# Patient Record
Sex: Female | Born: 1970 | Race: White | Hispanic: No | Marital: Married | Smoking: Former smoker
Health system: Southern US, Community
[De-identification: ages and names within clinical notes are randomized; demographics above are authoritative.]

## PROBLEM LIST (undated history)

## (undated) DIAGNOSIS — D72829 Elevated white blood cell count, unspecified: Secondary | ICD-10-CM

## (undated) HISTORY — DX: Elevated white blood cell count, unspecified: D72.829

## (undated) HISTORY — PX: ELBOW SURGERY: SHX618

## (undated) HISTORY — PX: HERNIA REPAIR: SHX51

## (undated) HISTORY — PX: CHOLECYSTECTOMY: SHX55

## (undated) HISTORY — PX: TONSILLECTOMY: SUR1361

---

## 2004-02-05 DIAGNOSIS — D229 Melanocytic nevi, unspecified: Secondary | ICD-10-CM

## 2004-02-05 HISTORY — DX: Melanocytic nevi, unspecified: D22.9

## 2006-02-27 ENCOUNTER — Emergency Department (HOSPITAL_COMMUNITY): Admission: EM | Admit: 2006-02-27 | Discharge: 2006-02-27 | Payer: Self-pay | Admitting: Emergency Medicine

## 2007-08-08 ENCOUNTER — Inpatient Hospital Stay (HOSPITAL_COMMUNITY): Admission: AD | Admit: 2007-08-08 | Discharge: 2007-08-08 | Payer: Self-pay | Admitting: Obstetrics

## 2007-08-09 ENCOUNTER — Inpatient Hospital Stay (HOSPITAL_COMMUNITY): Admission: AD | Admit: 2007-08-09 | Discharge: 2007-08-09 | Payer: Self-pay | Admitting: Obstetrics

## 2007-08-18 ENCOUNTER — Inpatient Hospital Stay (HOSPITAL_COMMUNITY): Admission: AD | Admit: 2007-08-18 | Discharge: 2007-08-21 | Payer: Self-pay | Admitting: *Deleted

## 2007-08-18 ENCOUNTER — Inpatient Hospital Stay (HOSPITAL_COMMUNITY): Admission: AD | Admit: 2007-08-18 | Discharge: 2007-08-18 | Payer: Self-pay | Admitting: *Deleted

## 2007-09-22 ENCOUNTER — Inpatient Hospital Stay (HOSPITAL_COMMUNITY): Admission: AD | Admit: 2007-09-22 | Discharge: 2007-09-25 | Payer: Self-pay | Admitting: *Deleted

## 2007-09-22 ENCOUNTER — Encounter (INDEPENDENT_AMBULATORY_CARE_PROVIDER_SITE_OTHER): Payer: Self-pay | Admitting: *Deleted

## 2010-07-13 ENCOUNTER — Encounter
Admission: RE | Admit: 2010-07-13 | Discharge: 2010-07-13 | Payer: Self-pay | Source: Home / Self Care | Attending: Unknown Physician Specialty | Admitting: Unknown Physician Specialty

## 2010-10-11 ENCOUNTER — Encounter (HOSPITAL_COMMUNITY): Payer: BC Managed Care – PPO

## 2010-10-11 ENCOUNTER — Other Ambulatory Visit: Payer: Self-pay | Admitting: General Surgery

## 2010-10-11 DIAGNOSIS — Z01812 Encounter for preprocedural laboratory examination: Secondary | ICD-10-CM | POA: Insufficient documentation

## 2010-10-11 DIAGNOSIS — Z01818 Encounter for other preprocedural examination: Secondary | ICD-10-CM | POA: Insufficient documentation

## 2010-10-11 LAB — CBC
MCHC: 33 g/dL (ref 30.0–36.0)
Platelets: 391 10*3/uL (ref 150–400)
RDW: 12.2 % (ref 11.5–15.5)
WBC: 13.3 10*3/uL — ABNORMAL HIGH (ref 4.0–10.5)

## 2010-10-11 LAB — BASIC METABOLIC PANEL
CO2: 26 mEq/L (ref 19–32)
Calcium: 9.3 mg/dL (ref 8.4–10.5)
Creatinine, Ser: 0.56 mg/dL (ref 0.4–1.2)
GFR calc Af Amer: >60 mL/min (ref >60)
GFR calc non Af Amer: >60 mL/min (ref >60)
Sodium: 138 mEq/L (ref 135–145)

## 2010-10-11 LAB — DIFFERENTIAL
Basophils Absolute: 0 10*3/uL (ref 0.0–0.1)
Basophils Relative: 0 % (ref 0–1)
Eosinophils Absolute: 0.1 10*3/uL (ref 0.0–0.7)
Eosinophils Relative: 1 % (ref 0–5)
Monocytes Absolute: 1 10*3/uL (ref 0.1–1.0)

## 2010-10-11 LAB — SURGICAL PCR SCREEN
MRSA, PCR: NEGATIVE
Staphylococcus aureus: NEGATIVE

## 2010-10-11 LAB — HCG, SERUM, QUALITATIVE: Preg, Serum: NEGATIVE

## 2010-10-14 ENCOUNTER — Other Ambulatory Visit: Payer: Self-pay | Admitting: General Surgery

## 2010-10-14 ENCOUNTER — Ambulatory Visit (HOSPITAL_COMMUNITY)
Admission: RE | Admit: 2010-10-14 | Discharge: 2010-10-14 | Disposition: A | Discharge status: Home / Self Care | Payer: BC Managed Care – PPO | Source: Ambulatory Visit | Attending: General Surgery | Admitting: General Surgery

## 2010-10-14 DIAGNOSIS — T81509A Unspecified complication of foreign body accidentally left in body following unspecified procedure, initial encounter: Secondary | ICD-10-CM | POA: Insufficient documentation

## 2010-10-14 DIAGNOSIS — K429 Umbilical hernia without obstruction or gangrene: Secondary | ICD-10-CM | POA: Insufficient documentation

## 2010-10-14 DIAGNOSIS — L923 Foreign body granuloma of the skin and subcutaneous tissue: Secondary | ICD-10-CM | POA: Insufficient documentation

## 2010-10-14 DIAGNOSIS — T81508A Unspecified complication of foreign body accidentally left in body following other procedure, initial encounter: Secondary | ICD-10-CM | POA: Insufficient documentation
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
## 2010-11-09 NOTE — Discharge Summary (Signed)
NAME:  Laura Blankenship, Laura Blankenship                 ACCOUNT NO.:  [id number]   MEDICAL RECORD NO.:  [id number]          PATIENT TYPE:  INP   LOCATION:  9175                          FACILITY:  [facility]   PHYSICIAN:  Gerri Spore B. Earlene Plater, M.D.  DATE OF BIRTH:  04/06/1971   DATE OF ADMISSION:  08/18/2007  DATE OF DISCHARGE:  08/21/2007                               DISCHARGE SUMMARY   ADMISSION DIAGNOSES:  1. A 34-week intrauterine pregnancy.  2. Preterm contractions.   DISCHARGE DIAGNOSES:  1. A 34-week intrauterine pregnancy.  2. Preterm contractions.   PROCEDURE:  Amniocentesis for fetal lung maturity (results consistent  with immaturity).   HISTORY OF PRESENT ILLNESS:  The patient presented with complaints of  regular painful contractions which were coming every 2-3 minutes for  several hours.  Cervix did efface to 90% and 1 cm dilation, but not  beyond this.  However, her contractions were extremely painful, and she  had a history of previous LEEP procedure; therefore, it was determined  that she might be in preterm labor, although the scarring from the LEEP  was preventing cervix from dilating as it felt very tight.   HOSPITAL COURSE:  The patient was admitted, monitored, ultimately given  an epidural for pain control, due to her persistent regular  contractions.  She did not dilate beyond 1 cm.  Ultimately,  amniocentesis was performed for fetal lung maturity, which was immature.  Contractions were stopped with Procardia, and the patient discharged to  home with labor precautions.  Follow up within the week at [facility].   DISPOSITION:  Discharged satisfactory.   DISCHARGE INSTRUCTIONS:  Labor precautions.      Gerri Spore B. Earlene Plater, M.D.  Electronically Signed     WBD/MEDQ  D:  09/11/2007  T:  09/11/2007  Job:  [id number]

## 2010-11-09 NOTE — Op Note (Signed)
NAME:  Laura Blankenship, Laura Blankenship                 ACCOUNT NO.:  [id number]   MEDICAL RECORD NO.:  [id number]          PATIENT TYPE:  INP   LOCATION:  9131                          FACILITY:  [facility]   PHYSICIAN:  Gerri Spore B. Earlene Plater, M.D.  DATE OF BIRTH:  1970-10-05   DATE OF PROCEDURE:  09/22/2007  DATE OF DISCHARGE:                               OPERATIVE REPORT   PREOPERATIVE DIAGNOSIS:  Repetitive variable decelerations.   POSTOPERATIVE DIAGNOSIS:  Repetitive variable decelerations.   PROCEDURE:  Attempted vacuum delivery followed by a primary low  transverse C-section.   SURGEON:  Chester Holstein. Earlene Plater, M.D.   ASSISTANT:  Marlinda Mike, C.N.M.   ANESTHESIA:  Epidural.   SPECIMENS:  Placenta submitted to pathology.   BLOOD LOSS:  500.   COMPLICATIONS:  None.   FINDINGS:  Viable female, Apgars 9 and 9.  Body cord noted anteriorly.  Weight 7 pounds 3 ounces.  Normal-appearing uterus, tubes and ovaries.   INDICATIONS:  The patient presented with spontaneous rupture of  membranes at term today.  Had a normal labor course and was pushing from  complete, complete, +2-+3, LOA position when she developed repetitive  variable decelerations with fetal heart rate dropping into the 60s to  70s range.  I counseled the patient that vacuum delivery would expedite  the delivery and if unsuccessful would recommend a C-section given the  repetitive variable decelerations.  The risks and benefits were  discussed.   PROCEDURE:  The patient was initially in the labor delivery room,  complete, complete +2/+3, LOA under epidural.  The bladder had been  drained just immediately prior to the procedure.  The Mityvac Mushroom  cup was placed on the flexion point in the midline.  From the high green  zone, three pulls were attempted.  One pop-off encountered with no  descent.  It felt to me that the pubic arch was a bit narrow, and this  was limiting our ability and that episiotomy would not gain much  additional room.   Given the repetitive decelerations and unsuccessful  vacuum, I recommended we proceed to C-section.   The OR was called.  Stat section called.  The patient rapidly taken  back.   A Pfannenstiel incision made.  The fascia divided sharply, elevated, and  the posteriorly lying rectus muscles were dissected off sharply.  Repeated inferiorly in similar manner.  Posterior sheath and peritoneum  were elevated and incised sharply.  Bladder blade inserted.  Bladder  flap created sharply.   Uterine incision made in low transverse fashion with a knife.  Clear  fluid at amniotomy.  Incision was extended bluntly.  Vertex elevated  through the incision and with fundal pressure delivered out difficulty.  Nose and mouth suctioned with the bulb.  Remainder of the infant  delivered without difficulty.  Cord was clamped and cut.  Infant handed  off to awaiting pediatricians.  Mefoxin was given at cord clamp.  The  placenta was removed by uterine massage.  Uterus was exteriorized and  cleared of all clots and debris.  It was free of extension.  Uterine  incision closed in a running locked fashion with 0 chromic.  Second  imbricating layer placed with same suture.  Hemostasis obtained.   Uterus was turned to the abdomen, pelvis irrigated, cleared of all clots  and debris.  Uterine incision and bladder flap were hemostatic.  Subfascial space was hemostatic.  Fascia was closed with running stitch  of 0 Vicryl.  Subcutaneous tissue was irrigated and was hemostatic.  Skin was closed with staples.   The patient tolerated procedure well with no complications.  She was  taken to the recovery room awake, alert, and in stable condition.  All  counts correct per the operating room staff.      Gerri Spore B. Earlene Plater, M.D.  Electronically Signed     WBD/MEDQ  D:  09/22/2007  T:  09/23/2007  Job:  [id number]

## 2010-11-12 NOTE — Discharge Summary (Signed)
NAME:  Laura Blankenship, Laura Blankenship                 ACCOUNT NO.:  [id number]   MEDICAL RECORD NO.:  [id number]          PATIENT TYPE:  INP   LOCATION:  9131                          FACILITY:  [facility]   PHYSICIAN:  Gerri Spore B. Earlene Plater, M.D.  DATE OF BIRTH:  12/11/1970   DATE OF ADMISSION:  09/22/2007  DATE OF DISCHARGE:  09/25/2007                               DISCHARGE SUMMARY   ADMISSION DIAGNOSES:  1. 39-week pregnancy.  2. Spontaneous labor.   DISCHARGE DIAGNOSES:  1. 39-week pregnancy.  2. Spontaneous labor.   PROCEDURES:  Unsuccessful trial of vacuum for repetitive variables,  subsequent primary C-section.   HISTORY OF PRESENT ILLNESS:  For complete details, please see the H&P on  the chart.  Briefly, the patient presented at 39 weeks spontaneous  labor.  She progressed to complete and was pushing at +2 stage when she  was noted to have repetitive variable decelerations to the 70s.  Given  this, I was called and requested to attempt a vacuum delivery.   HOSPITAL COURSE:  Subsequently, attempt was made for vacuum delivery  without success followed by C-section ultimately delivered a viable  female.  Apgars were 9 and 9.  Body cord noted anteriorly.  Weight was 7  pounds 3 ounces.   Postoperatively, the patient rapidly regained her ability to ambulate,  void, and tolerate regular diet.  She was discharged home on the third  postoperative day in satisfactory condition.   DISCHARGE INSTRUCTIONS:  Per booklet.   FOLLOWUP:  6 weeks [facility].   DISPOSITION AT DISCHARGE:  Satisfactory.   Of note, the baby was admitted to the NICU postpartum for seizures.  This was ultimately determined to be related to a stroke on MRI.  It Is  unclear when the stroke occurred, but the baby was stable and doing well  at the time of the mother's discharge.      Gerri Spore B. Earlene Plater, M.D.  Electronically Signed    WBD/MEDQ  D:  10/17/2007  T:  10/18/2007  Job:  [id number]

## 2011-03-18 LAB — GLUCOSE, SEROUS FLUID: Glucose, Fluid: 37

## 2011-03-18 LAB — CBC
HCT: 34.8 — ABNORMAL LOW
Platelets: 415 — ABNORMAL HIGH
RDW: 12.9

## 2011-03-18 LAB — LEUKOCYTE ESTERASE, AMNIOTIC FLUID: Leukocyte esterase, amniotic: NEGATIVE

## 2011-03-18 LAB — FLM(FETAL LUNG MATURITY), AMNIOTIC FLUID: Fetal/Lung Maturity: 29.9

## 2011-03-18 LAB — RPR: RPR Ser Ql: NONREACTIVE

## 2011-03-18 LAB — BODY FLUID CELL COUNT WITH DIFFERENTIAL: Total Nucleated Cell Count, Fluid: 1

## 2011-03-18 LAB — BODY FLUID CULTURE: Gram Stain: NONE SEEN

## 2011-03-18 LAB — STREP B DNA PROBE

## 2011-03-21 LAB — CBC
HCT: 33.7 — ABNORMAL LOW
Hemoglobin: 10.3 — ABNORMAL LOW
MCHC: 35.5
MCV: 88.3
Platelets: 321
RBC: 3.29 — ABNORMAL LOW
WBC: 14.3 — ABNORMAL HIGH

## 2011-03-21 LAB — COMPREHENSIVE METABOLIC PANEL
ALT: 12
Albumin: 2.8 — ABNORMAL LOW
Alkaline Phosphatase: 199 — ABNORMAL HIGH
Potassium: 4.2
Sodium: 133 — ABNORMAL LOW
Total Protein: 5.9 — ABNORMAL LOW

## 2011-03-21 LAB — RPR: RPR Ser Ql: NONREACTIVE

## 2012-04-27 DIAGNOSIS — Z0000 Encounter for general adult medical examination without abnormal findings: Secondary | ICD-10-CM | POA: Insufficient documentation

## 2012-12-19 ENCOUNTER — Other Ambulatory Visit: Payer: Self-pay | Admitting: Physician Assistant

## 2013-06-15 DIAGNOSIS — D72829 Elevated white blood cell count, unspecified: Secondary | ICD-10-CM | POA: Insufficient documentation

## 2013-06-18 ENCOUNTER — Ambulatory Visit (HOSPITAL_BASED_OUTPATIENT_CLINIC_OR_DEPARTMENT_OTHER)
Admission: RE | Admit: 2013-06-18 | Discharge: 2013-06-18 | Disposition: A | Discharge status: Home / Self Care | Payer: BC Managed Care – PPO | Source: Ambulatory Visit | Attending: Family Medicine | Admitting: Family Medicine

## 2013-06-18 ENCOUNTER — Other Ambulatory Visit (HOSPITAL_BASED_OUTPATIENT_CLINIC_OR_DEPARTMENT_OTHER): Payer: Self-pay | Admitting: Family Medicine

## 2013-06-18 DIAGNOSIS — R1011 Right upper quadrant pain: Secondary | ICD-10-CM | POA: Insufficient documentation

## 2013-06-18 DIAGNOSIS — R110 Nausea: Secondary | ICD-10-CM | POA: Insufficient documentation

## 2013-06-18 DIAGNOSIS — K828 Other specified diseases of gallbladder: Secondary | ICD-10-CM | POA: Insufficient documentation

## 2013-06-18 DIAGNOSIS — R1013 Epigastric pain: Secondary | ICD-10-CM

## 2014-02-13 DIAGNOSIS — K824 Cholesterolosis of gallbladder: Secondary | ICD-10-CM | POA: Insufficient documentation

## 2014-04-21 DIAGNOSIS — Z803 Family history of malignant neoplasm of breast: Secondary | ICD-10-CM | POA: Insufficient documentation

## 2014-04-21 DIAGNOSIS — Z800 Family history of malignant neoplasm of digestive organs: Secondary | ICD-10-CM | POA: Insufficient documentation

## 2015-11-28 IMAGING — US US ABDOMEN COMPLETE
1 series · 13 of 25 positions shown · non-contrast
Comparison: None.

CLINICAL DATA: Nausea, epigastric and right upper quadrant
discomfort.

EXAM:
ULTRASOUND ABDOMEN COMPLETE

[Series 1: us abdomen complete · 0.30mm/px · 13 of 77 slices shown]
[im 1/77]
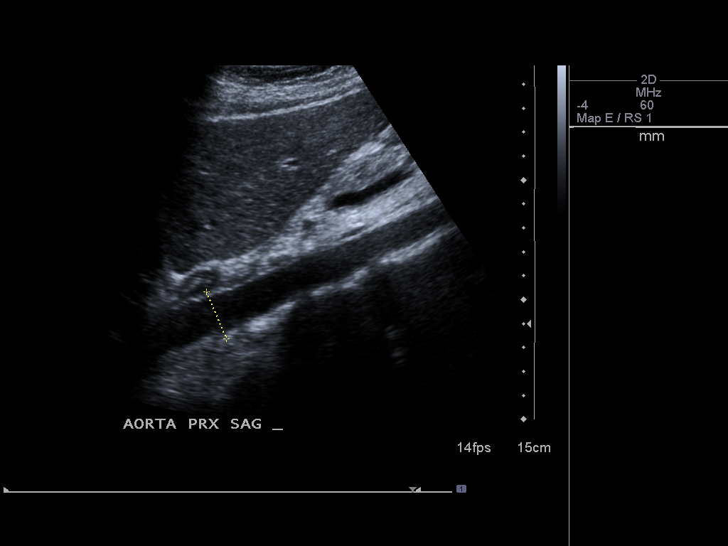
[im 7/77]
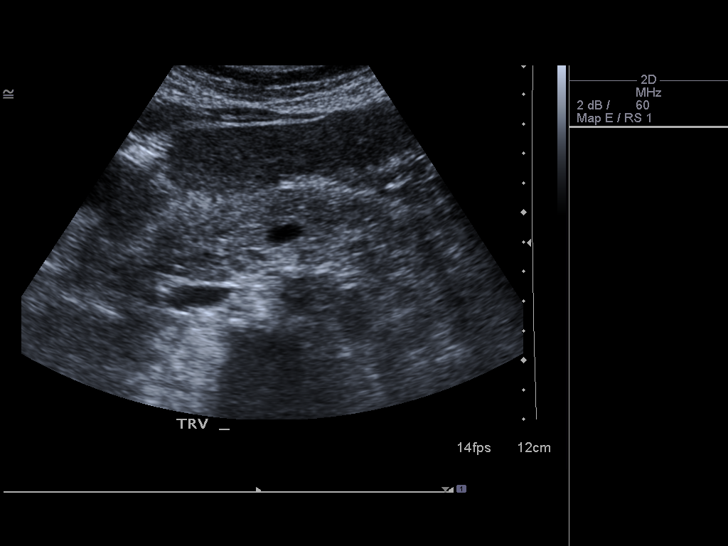
[im 13/77]
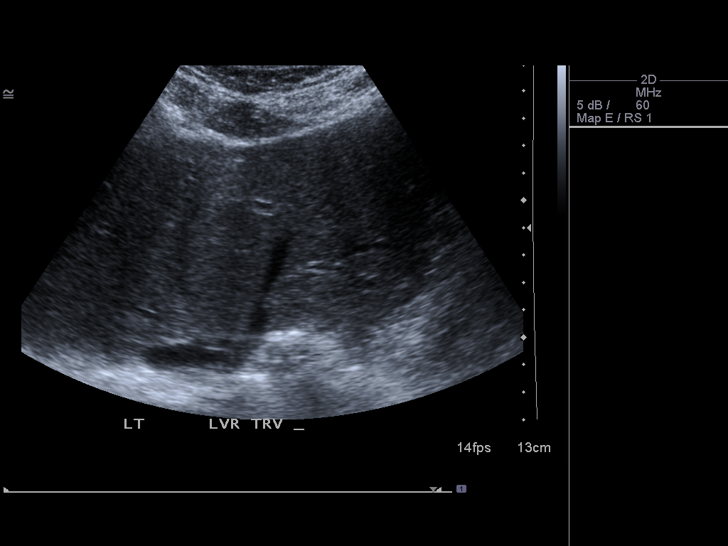
[im 20/77]
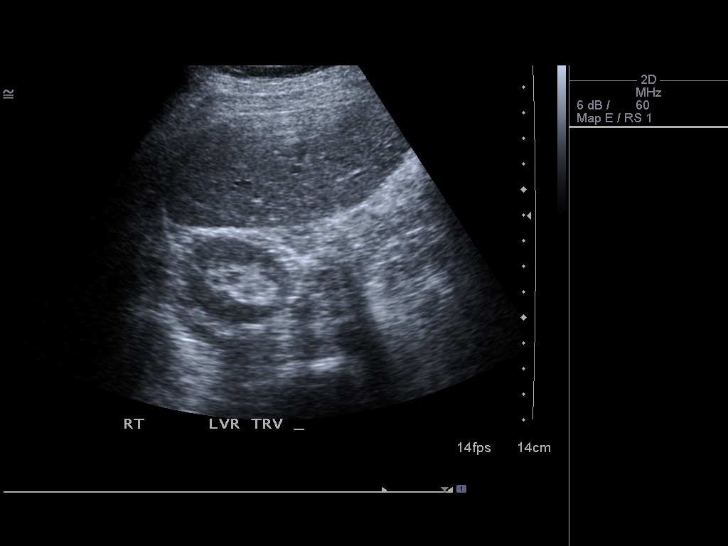
[im 26/77]
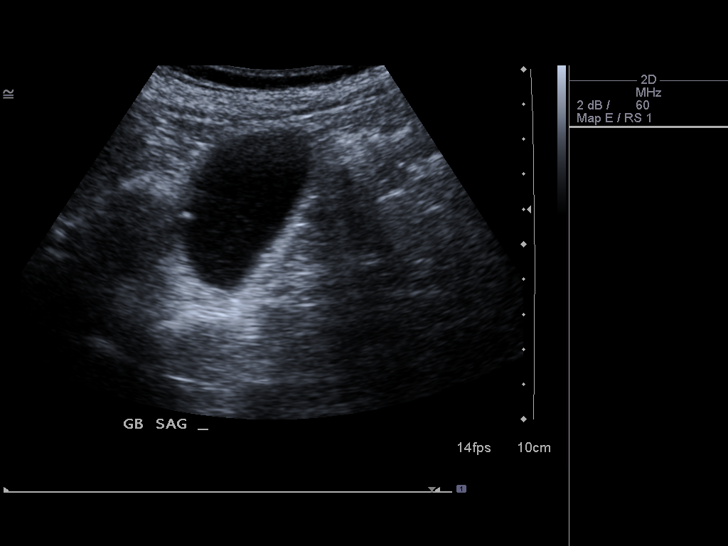
[im 32/77]
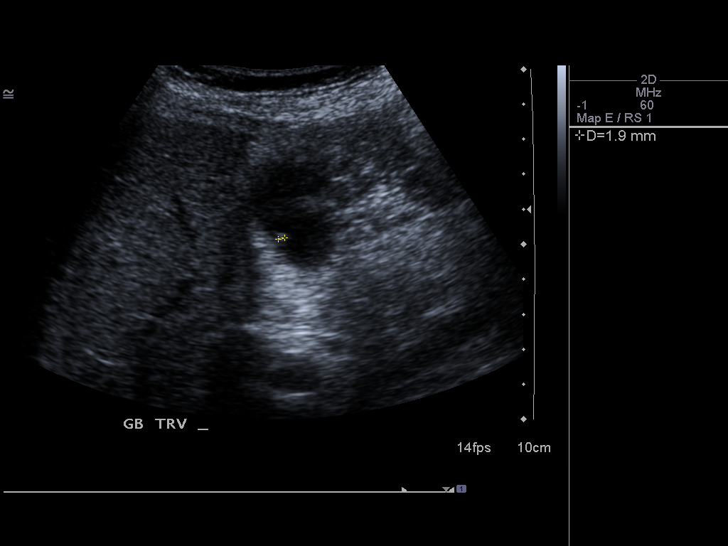
[im 39/77]
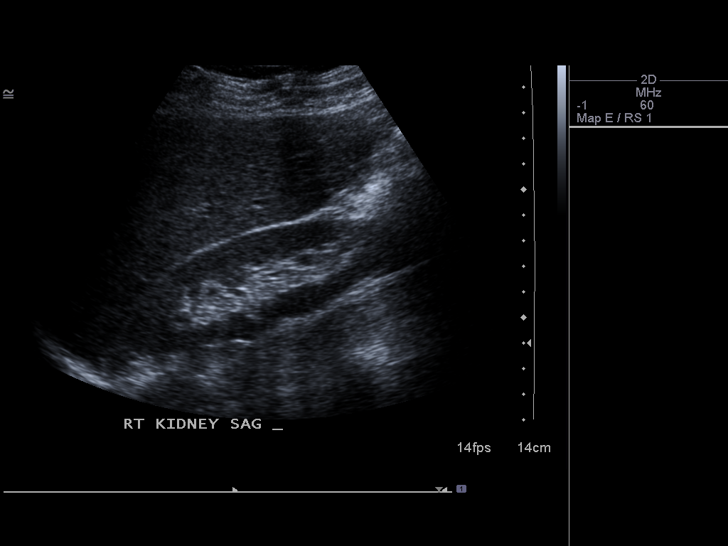
[im 45/77]
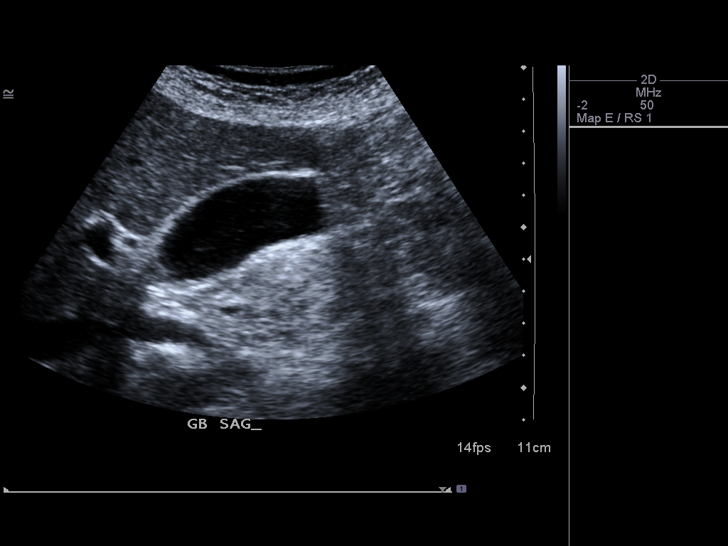
[im 51/77]
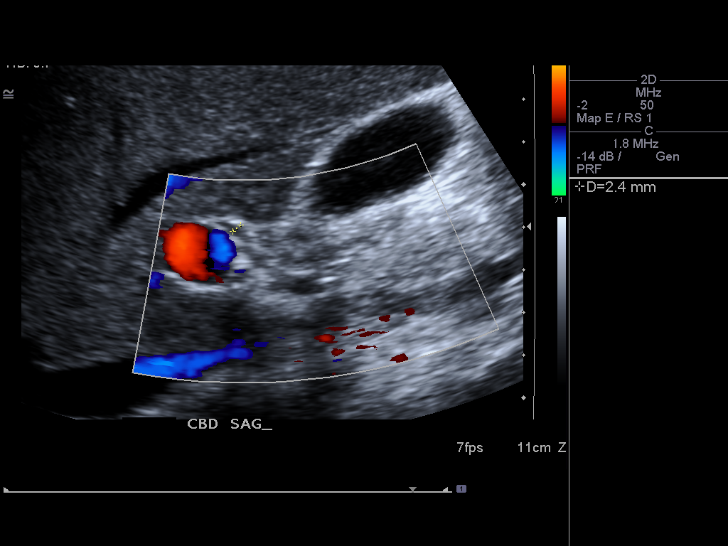
[im 58/77]
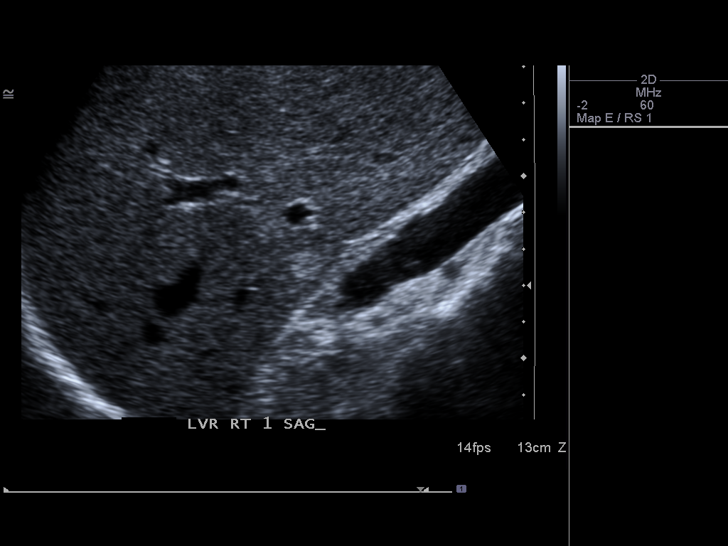
[im 64/77]
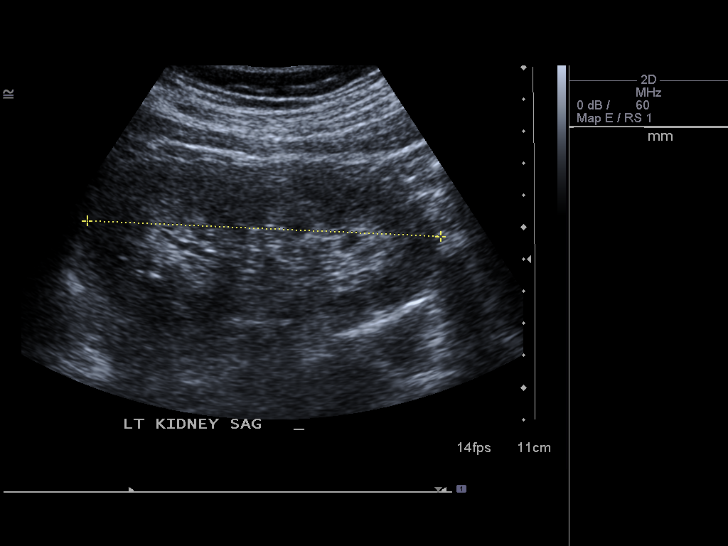
[im 70/77]
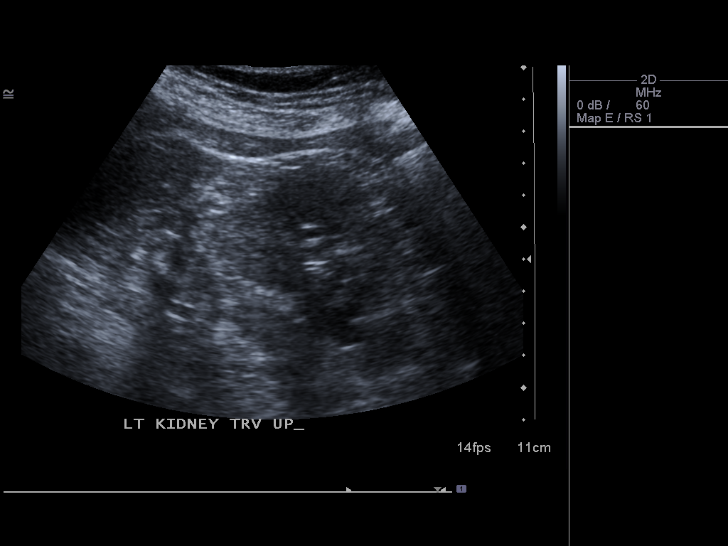
[im 77/77]
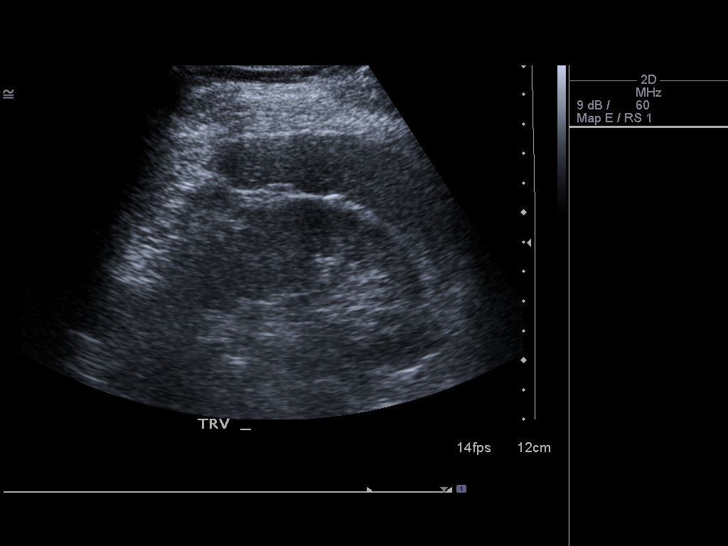

[13 of 25 positions shown; findings below may reference images not displayed]

FINDINGS: Gallbladder:

The gallbladder is adequately distended. No mobile stones are
demonstrated. There is echogenic bile present versus sludge. There
is a 2 mm diameter adherent echogenic focus along the gallbladder
wall that suggests a polyp versus adherent stone. There is no
pericholecystic fluid. The gallbladder wall measures 3 mm in
thickness. There is no positive sonographic Murphy's sign.

Common bile duct:

Diameter: 2 mm

Liver:

The echotexture of the liver is within the limits of normal.
Posteriorly in the right lobe there is a 0.9 x 0.9 x 0.7 cm diameter
hyperechoic focus which likely reflects a hemangioma. There is no
intrahepatic ductal dilation.

IVC:

Bowel gas limits evaluation of the inferior vena cava.  .

Pancreas:

Visualized portion unremarkable.

Spleen:

Size and appearance within normal limits.

Right Kidney:

Length: 11.2 cm. Echogenicity within normal limits. No mass or
hydronephrosis visualized.

Left Kidney:

Length: 11 cm. Echogenicity within normal limits. No mass or
hydronephrosis visualized.

Abdominal aorta:

Evaluation of the abdominal aorta is limited. No aneurysm is
demonstrated. .

Other findings:

None.
IMPRESSION: 1. The gallbladder contains echogenic bile and there is likely an
adherent stone versus polyp measuring 2 mm. There is no gallbladder
wall thickening or pericholecystic fluid or positive sonographic
Murphy's sign.
2. The liver, pancreas, and spleen exhibit no acute abnormalities.
There is likely a subcentimeter hemangioma posteriorly in the right
hepatic lobe.
3. The kidneys appear normal.
4. Where visualized the abdominal aorta and inferior vena cava
appear normal.

## 2018-04-27 ENCOUNTER — Encounter: Payer: Self-pay | Admitting: Podiatry

## 2018-04-27 ENCOUNTER — Ambulatory Visit: Payer: BLUE CROSS/BLUE SHIELD | Admitting: Podiatry

## 2018-04-27 ENCOUNTER — Ambulatory Visit (INDEPENDENT_AMBULATORY_CARE_PROVIDER_SITE_OTHER): Payer: BLUE CROSS/BLUE SHIELD

## 2018-04-27 DIAGNOSIS — M722 Plantar fascial fibromatosis: Secondary | ICD-10-CM | POA: Diagnosis not present

## 2018-04-27 NOTE — Patient Instructions (Signed)

## 2018-04-27 NOTE — Progress Notes (Signed)
   Subjective:    Patient ID: Laura Blankenship, female    DOB: 29-May-1971, 46 y.o.   MRN: [id number]  HPI  47 year old female presents the office today for concerns of a soft tissue mass in the arch of the right foot which is been ongoing for about 6 months.  He states that it is about the same size and she just first noticed it 6 months ago.  It hurts more with going barefoot or wearing a flat shoe.  She said no recent treatment.  She has no other concerns today.  Review of Systems  All other systems reviewed and are negative.  History reviewed. No pertinent past medical history.  History reviewed. No pertinent surgical history.   Current Outpatient Medications:  .  NON FORMULARY, [facility]  Scar Cream -  Verapamil 10%, Pentoxifylline 5% Apply 1-2 grams to affected area 3-4 times daily Qty. 120 gm 3 refills, Disp: , Rfl:   No Known Allergies      Objective:   Physical Exam   General: AAO x3, NAD  Dermatological: Skin is warm, dry and supple bilateral. Nails x 10 are well manicured; remaining integument appears unremarkable at this time. There are no open sores, no preulcerative lesions, no rash or signs of infection present.  Vascular: Dorsalis Pedis artery and Posterior Tibial artery pedal pulses are 2/4 bilateral with immedate capillary fill time. Pedal hair growth present. No varicosities and no lower extremity edema present bilateral. There is no pain with calf compression, swelling, warmth, erythema.   Neruologic: Grossly intact via light touch bilateral. Protective threshold with Semmes Wienstein monofilament intact to all pedal sites bilateral.   Musculoskeletal: Along the medial band of plantar fascia within the arch of the foot on the right side is a firm non-mobile soft tissue mass consistent with a plantar fibroma.  Mild discomfort to the area there is no overlying edema, erythema or any skin change.  No other areas of tenderness or soft tissue mass are identified.   Muscular strength 5/5 in all groups tested bilateral.  Gait: Unassisted, Nonantalgic.     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  No evidence of calcifications or bony destruction.  No evidence of acute fracture or foreign body. -Steroid injection performed.  See procedure note below. -Ordered a compound cream to include verapamil to apply daily.  Discussed general stretching exercises for the plantar fascia as well.  Offloading pads dispensed.  Procedure: Injection plantar fibroma Discussed alternatives, risks, complications and verbal consent was obtained.  Location: Right plantar fibroma; medial approach. Skin Prep: Alcohol. Injectate: 0.5cc 0.5% marcaine plain, 0.5 cc 2% lidocaine plain and, 1 cc kenalog 10. Disposition: Patient tolerated procedure well. Injection site dressed with a band-aid.  Post-injection care was discussed and return precautions discussed.   Trula Slade DPM

## 2018-04-29 DIAGNOSIS — M722 Plantar fascial fibromatosis: Secondary | ICD-10-CM | POA: Insufficient documentation

## 2018-06-01 ENCOUNTER — Ambulatory Visit: Payer: BLUE CROSS/BLUE SHIELD | Admitting: Podiatry

## 2018-06-08 ENCOUNTER — Ambulatory Visit: Payer: BLUE CROSS/BLUE SHIELD | Admitting: Podiatry

## 2018-06-08 DIAGNOSIS — M722 Plantar fascial fibromatosis: Secondary | ICD-10-CM

## 2018-06-08 MED ORDER — TRIAMCINOLONE ACETONIDE 10 MG/ML IJ SUSP
10.0000 mg | Freq: Once | INTRAMUSCULAR | Status: AC
  Administered 2018-06-08: 10 mg

## 2018-06-13 NOTE — Progress Notes (Signed)
Subjective: 47 year old female presents the office today for follow-up evaluation of a plantar fibroma in her right foot.  She states that overall she is doing much better and the area is smaller and the injection did help and she had no complications.  He is also been using the verapamil cream daily as well.  She has no new concerns. Denies any systemic complaints such as fevers, chills, nausea, vomiting. No acute changes since last appointment, and no other complaints at this time.   Objective: AAO x3, NAD DP/PT pulses palpable bilaterally, CRT less than 3 seconds On the medial band plantar fashion the arch of the foot on the right side there is a small area of a plantar fibroma still evident but overall is much smaller.  There is no significant edema, erythema and very minimal tenderness subjectively and there is no tenderness today.  No new lesions are identified.  No open lesions or pre-ulcerative lesions.  No pain with calf compression, swelling, warmth, erythema  Assessment: Plantar fibromatosis right side  Plan: -All treatment options discussed with the patient including all alternatives, risks, complications.  -Steroid injection performed today.  See procedure note below.  Continue with the verapamil cream daily.  Discussed stretching exercises daily. -Follow-up next 4 to 6 weeks if symptoms continue with is any worsening let us know otherwise we will see her as needed. -Patient encouraged to call the office with any questions, concerns, change in symptoms.   Procedure: Injection Tendon/Ligament  Discussed alternatives, risks, complications and verbal consent was obtained.  Location: Right plantar fibroma Skin Prep: Alcohol. Injectate: 0.5cc 0.5% marcaine plain, 0.5 cc 2% lidocaine plain and, 1 cc kenalog 10. Disposition: Patient tolerated procedure well. Injection site dressed with a band-aid.  Post-injection care was discussed and return precautions discussed.   Trula Slade  DPM

## 2019-06-17 ENCOUNTER — Other Ambulatory Visit: Payer: Self-pay | Admitting: Ophthalmology

## 2019-10-18 ENCOUNTER — Other Ambulatory Visit: Payer: Self-pay

## 2019-10-18 ENCOUNTER — Encounter: Payer: Self-pay | Admitting: Physician Assistant

## 2019-10-18 ENCOUNTER — Ambulatory Visit: Payer: BC Managed Care – PPO | Admitting: Physician Assistant

## 2019-10-18 DIAGNOSIS — L700 Acne vulgaris: Secondary | ICD-10-CM

## 2019-10-18 DIAGNOSIS — D485 Neoplasm of uncertain behavior of skin: Secondary | ICD-10-CM

## 2019-10-18 DIAGNOSIS — Z1283 Encounter for screening for malignant neoplasm of skin: Secondary | ICD-10-CM | POA: Diagnosis not present

## 2019-10-18 NOTE — Patient Instructions (Signed)

## 2019-10-18 NOTE — Progress Notes (Signed)
   New Patient   Subjective  Laura Blankenship is a 49 y.o. female who presents for the following: Annual Exam (Here for overdue skin check. Does have a place under her right breast that seems to have increase in size. ).  Location: Right upper abdomen Duration: year plus Quality: white, smooth growth Associated Signs/Symptoms:none Modifying Factors: persistent growing     The following portions of the chart were reviewed this encounter and updated as appropriate: Tobacco  Allergies  Meds  Problems  Med Hx  Surg Hx  Fam Hx      Objective  Well appearing patient in no apparent distress; mood and affect are within normal limits.  A full examination was performed including scalp, head, eyes, ears, nose, lips, neck, chest, axillae, abdomen, back, buttocks, bilateral upper extremities, bilateral lower extremities, hands, feet, fingers, toes, fingernails, and toenails. All findings within normal limits unless otherwise noted below.  Objective  Right Abdomen (side) - Upper: Bichromic dark nested macule.      Objective  Mid Back: Dark , raised cyst  Objective  head to toe: No signs nmsc/DN   Assessment & Plan  Neoplasm of uncertain behavior of skin Right Abdomen (side) - Upper  Skin / nail biopsy Type of biopsy: tangential   Informed consent: discussed and consent obtained   Timeout: patient name, date of birth, surgical site, and procedure verified   Anesthesia: the lesion was anesthetized in a standard fashion   Anesthetic:  1% lidocaine w/ epinephrine 1-100,000 local infiltration Instrument used: flexible razor blade   Hemostasis achieved with: aluminum chloride and electrodesiccation   Outcome: patient tolerated procedure well   Post-procedure details: wound care instructions given   Additional details:  Cautery only  Specimen 1 - Surgical pathology Differential Diagnosis: DN Check Margins: No  Open comedone Mid Back  Schedule for a biopsy  Screening exam  for skin cancer head to toe  observe

## 2019-11-29 DIAGNOSIS — R601 Generalized edema: Secondary | ICD-10-CM | POA: Diagnosis not present

## 2019-11-29 DIAGNOSIS — I340 Nonrheumatic mitral (valve) insufficiency: Secondary | ICD-10-CM | POA: Diagnosis not present

## 2019-12-11 ENCOUNTER — Encounter: Payer: Self-pay | Admitting: *Deleted

## 2019-12-12 DIAGNOSIS — R600 Localized edema: Secondary | ICD-10-CM | POA: Diagnosis not present

## 2019-12-12 DIAGNOSIS — E041 Nontoxic single thyroid nodule: Secondary | ICD-10-CM | POA: Diagnosis not present

## 2019-12-12 DIAGNOSIS — D709 Neutropenia, unspecified: Secondary | ICD-10-CM | POA: Diagnosis not present

## 2019-12-12 DIAGNOSIS — D72821 Monocytosis (symptomatic): Secondary | ICD-10-CM | POA: Diagnosis not present

## 2019-12-12 DIAGNOSIS — D72829 Elevated white blood cell count, unspecified: Secondary | ICD-10-CM | POA: Diagnosis not present

## 2019-12-12 DIAGNOSIS — Z803 Family history of malignant neoplasm of breast: Secondary | ICD-10-CM | POA: Diagnosis not present

## 2019-12-13 ENCOUNTER — Encounter: Payer: Self-pay | Admitting: Physician Assistant

## 2019-12-13 ENCOUNTER — Other Ambulatory Visit: Payer: Self-pay

## 2019-12-13 ENCOUNTER — Ambulatory Visit (INDEPENDENT_AMBULATORY_CARE_PROVIDER_SITE_OTHER): Payer: BC Managed Care – PPO | Admitting: Physician Assistant

## 2019-12-13 DIAGNOSIS — D485 Neoplasm of uncertain behavior of skin: Secondary | ICD-10-CM

## 2019-12-13 DIAGNOSIS — L720 Epidermal cyst: Secondary | ICD-10-CM | POA: Diagnosis not present

## 2019-12-13 NOTE — Progress Notes (Signed)
   Follow-Up Visit   Subjective  Laura Blankenship is a 49 y.o. female who presents for the following: Procedure (Punch biopsy on cyst right back. ).    The following portions of the chart were reviewed this encounter and updated as appropriate: Tobacco  Allergies  Meds  Problems  Med Hx  Surg Hx  Fam Hx      Objective  Well appearing patient in no apparent distress; mood and affect are within normal limits.  All skin waist up examined.  Objective  Left Upper Back: Nodule with dark central plug  Assessment & Plan  Neoplasm of uncertain behavior of skin Left Upper Back  Skin / nail biopsy Type of biopsy: punch   Informed consent: discussed and consent obtained   Procedure prep:  Patient was prepped and draped in usual sterile fashion (nonsterile) Prep type:  Chlorhexidine Anesthesia: the lesion was anesthetized in a standard fashion   Anesthetic:  1% lidocaine w/ epinephrine 1-100,000 local infiltration Punch size:  8 mm Suture size:  3-0 Suture type: nylon   Suture removal (days):  14 Hemostasis achieved with: suture   Outcome: patient tolerated procedure well   Post-procedure details: wound care instructions given   Post-procedure details comment:  Nonsterile  Specimen 1 - Surgical pathology Differential Diagnosis: cyst, r/o atypia Check Margins: No    92mm punch 3-0 nylon x 3

## 2019-12-13 NOTE — Patient Instructions (Addendum)
Wound Care Instructions  1. Cleanse wound gently with soap and water once a day then pat dry with clean gauze. Apply a thing coat of Petrolatum (petroleum jelly, "Vaseline") over the wound (unless you have an allergy to this). We recommend that you use a new, sterile tube of Vaseline. Do not pick or remove scabs. Do not remove the yellow or white "healing tissue" from the base of the wound.  2. Cover the wound with fresh, clean, nonstick gauze and secure with paper tape. You may use Band-Aids in place of gauze and tape if the would is small enough, but would recommend trimming much of the tape off as there is often too much. Sometimes Band-Aids can irritate the skin.  3. You should call the office for your biopsy report after 1 week if you have not already been contacted.  4. If you experience any problems, such as abnormal amounts of bleeding, swelling, significant bruising, significant pain, or evidence of infection, please call the office immediately.  5. FOR ADULT SURGERY PATIENTS: If you need something for pain relief you may take 1 extra strength Tylenol (acetaminophen) AND 2 Ibuprofen (200mg  each) together every 4 hours as needed for pain. (do not take these if you are allergic to them or if you have a reason you should not take them.) Typically, you may only need pain medication for 1 to 3 days.    Biopsy, Surgery (Curettage) & Surgery (Excision) Aftercare Instructions  1. Okay to remove bandage in 24 hours  2. Wash area with soap and water  3. Apply Vaseline to area twice daily until healed (Not Neosporin)  4. Okay to cover with a Band-Aid to decrease the chance of infection or prevent irritation from clothing; also it's okay to uncover lesion at home.  5. Suture instructions: return to our office in 7-10 or 10-14 days for a nurse visit for suture removal. Variable healing with sutures, if pain or itching occurs call our office. It's okay to shower or bathe 24 hours after sutures are  given.  6. The following risks may occur after a biopsy, curettage or excision: bleeding, scarring, discoloration, recurrence, infection (redness, yellow drainage, pain or swelling).  7. For questions, concerns and results call our office at [place] before 4pm & Friday before 3pm. Biopsy results will be available in 1 week.

## 2019-12-20 ENCOUNTER — Other Ambulatory Visit: Payer: Self-pay

## 2019-12-20 ENCOUNTER — Ambulatory Visit (INDEPENDENT_AMBULATORY_CARE_PROVIDER_SITE_OTHER): Payer: BC Managed Care – PPO | Admitting: *Deleted

## 2019-12-20 DIAGNOSIS — Z4802 Encounter for removal of sutures: Secondary | ICD-10-CM

## 2019-12-20 NOTE — Progress Notes (Addendum)
Here for NTS suture removal. Looks good no signs of infection. X 3 sutures removed. Applied steri strips to area since patient was a few days early for removal.

## 2019-12-24 NOTE — Progress Notes (Signed)
Referring-Erin Marveen Reeks FNP Reason for referral-mitral regurgitation, generalized edema, palpitations and chest tightness  HPI: 49 year old female for evaluation of mitral regurgitation, generalized edema, palpitations and chest tightness at request of Amador Cunas FNP.  Laboratories May 2021 showed sodium 139, potassium 4.3 and creatinine 0.74; hemoglobin 15.7; TSH 1.212.  Echocardiogram June 2021 showed normal LV function and mild mitral regurgitation.  Patient has dyspnea with more vigorous activities but not routine activities.  No orthopnea, PND, pedal edema.  She has noticed some bilateral shoulder tightness in February, March and April that was relieved with taking Lasix.  She felt she had increased edema in her upper extremities and upper body that resolved with Lasix.  Her shoulder tightness resolved as well.  She does not have exertional chest pain.  She occasionally has palpitations described as her heart racing predominately when waking up suddenly.  Because of the above cardiology asked to evaluate.  Current Outpatient Medications  Medication Sig Dispense Refill  . furosemide (LASIX) 20 MG tablet Take 20 mg by mouth daily as needed.      No current facility-administered medications for this visit.    No Known Allergies   Past Medical History:  Diagnosis Date  . Atypical mole 02/05/2004   Right Upper Breast (Dr. Katrina Stack)  . Atypical mole 02/17/2005   Left Breast (Marked)  . Atypical mole 02/17/2005   Supra Pubic (Moderate to Marked) (Dr. Gerome Apley II)  . Atypical mole 08/28/2008   Left Lateral Abd, Sup (Moderate)  . Atypical mole 08/28/2008   Left Lateral Abd, Inf (Slight to Moderate)  . Atypical mole 08/28/2008   Left Lateral Abd, Medial (Moderate)  . Atypical mole 03/18/2010   Left Tail od Breast (Moderate) (widershave)  . Atypical mole 12/19/2012   Mid Abdomen (widershave)  . Elevated white blood cell count     Past Surgical History:  Procedure Laterality  Date  . CHOLECYSTECTOMY    . ELBOW SURGERY    . HERNIA REPAIR    . TONSILLECTOMY      Social History   Socioeconomic History  . Marital status: Married    Spouse name: Not on file  . Number of children: 2  . Years of education: Not on file  . Highest education level: Not on file  Occupational History  . Not on file  Tobacco Use  . Smoking status: Former Research scientist (life sciences)  . Smokeless tobacco: Never Used  Vaping Use  . Vaping Use: Never used  Substance and Sexual Activity  . Alcohol use: Yes    Comment: Rare  . Drug use: Never  . Sexual activity: Not on file  Other Topics Concern  . Not on file  Social History Narrative  . Not on file   Social Determinants of Health   Financial Resource Strain:   . Difficulty of Paying Living Expenses:   Food Insecurity:   . Worried About Charity fundraiser in the Last Year:   . Arboriculturist in the Last Year:   Transportation Needs:   . Film/video editor (Medical):   Marland Kitchen Lack of Transportation (Non-Medical):   Physical Activity:   . Days of Exercise per Week:   . Minutes of Exercise per Session:   Stress:   . Feeling of Stress :   Social Connections:   . Frequency of Communication with Friends and Family:   . Frequency of Social Gatherings with Friends and Family:   . Attends Religious Services:   . Active  Member of Clubs or Organizations:   . Attends Archivist Meetings:   Marland Kitchen Marital Status:   Intimate Partner Violence:   . Fear of Current or Ex-Partner:   . Emotionally Abused:   Marland Kitchen Physically Abused:   . Sexually Abused:     Family History  Problem Relation Age of Onset  . Breast cancer Mother     ROS: no fevers or chills, productive cough, hemoptysis, dysphasia, odynophagia, melena, hematochezia, dysuria, hematuria, rash, seizure activity, orthopnea, PND, pedal edema, claudication. Remaining systems are negative.  Physical Exam:   Blood pressure 110/60, pulse 70, height 5\' 5"  (1.651 m), weight 149 lb 0.6 oz  (67.6 kg).  General:  Well developed/well nourished in NAD Skin warm/dry Patient not depressed No peripheral clubbing Back-normal HEENT-normal/normal eyelids Neck supple/normal carotid upstroke bilaterally; no bruits; no JVD; no thyromegaly chest - CTA/ normal expansion CV - RRR/normal S1 and S2; no murmurs, rubs or gallops;  PMI nondisplaced Abdomen -NT/ND, no HSM, no mass, + bowel sounds, no bruit 2+ femoral pulses, no bruits Ext-no edema, chords, 2+ DP Neuro-grossly nonfocal  ECG -Nov 07, 2019-sinus rhythm, cannot rule out septal infarct.  Personally reviewed  A/P  1 generalized edema-patient not volume overloaded on examination.  Echocardiogram shows normal LV function.  Can continue Lasix as needed.  2 palpitations-she has occasional palpitations that are not particular bothersome.  We discussed a monitor today and we will consider this in the future if her symptoms worsen.  3 chest tightness-symptoms do not sound to be cardiac in etiology.  If they persist we will consider a cardiac CTA.  4 mild mitral regurgitation-we will plan follow-up echo in the distant future.  Kirk Ruths, MD

## 2020-01-01 ENCOUNTER — Other Ambulatory Visit: Payer: Self-pay

## 2020-01-01 ENCOUNTER — Encounter: Payer: Self-pay | Admitting: Cardiology

## 2020-01-01 ENCOUNTER — Ambulatory Visit (INDEPENDENT_AMBULATORY_CARE_PROVIDER_SITE_OTHER): Payer: BC Managed Care – PPO | Admitting: Cardiology

## 2020-01-01 VITALS — BP 110/60 | HR 70 | Ht 65.0 in | Wt 149.0 lb

## 2020-01-01 DIAGNOSIS — R072 Precordial pain: Secondary | ICD-10-CM

## 2020-01-01 DIAGNOSIS — R002 Palpitations: Secondary | ICD-10-CM

## 2020-01-01 NOTE — Patient Instructions (Signed)

## 2020-02-13 DIAGNOSIS — Z20822 Contact with and (suspected) exposure to covid-19: Secondary | ICD-10-CM | POA: Diagnosis not present

## 2020-06-11 DIAGNOSIS — D751 Secondary polycythemia: Secondary | ICD-10-CM | POA: Diagnosis not present

## 2020-06-11 DIAGNOSIS — E042 Nontoxic multinodular goiter: Secondary | ICD-10-CM | POA: Diagnosis not present

## 2020-06-11 DIAGNOSIS — Z87891 Personal history of nicotine dependence: Secondary | ICD-10-CM | POA: Diagnosis not present

## 2020-06-11 DIAGNOSIS — D75839 Thrombocytosis, unspecified: Secondary | ICD-10-CM | POA: Diagnosis not present

## 2020-06-11 DIAGNOSIS — Z803 Family history of malignant neoplasm of breast: Secondary | ICD-10-CM | POA: Diagnosis not present

## 2020-06-11 DIAGNOSIS — D72829 Elevated white blood cell count, unspecified: Secondary | ICD-10-CM | POA: Diagnosis not present

## 2020-06-11 DIAGNOSIS — Z79899 Other long term (current) drug therapy: Secondary | ICD-10-CM | POA: Diagnosis not present

## 2020-06-11 DIAGNOSIS — Z800 Family history of malignant neoplasm of digestive organs: Secondary | ICD-10-CM | POA: Diagnosis not present

## 2020-11-18 DIAGNOSIS — Z800 Family history of malignant neoplasm of digestive organs: Secondary | ICD-10-CM | POA: Diagnosis not present

## 2020-11-18 DIAGNOSIS — Z803 Family history of malignant neoplasm of breast: Secondary | ICD-10-CM | POA: Diagnosis not present

## 2020-11-18 DIAGNOSIS — Z6824 Body mass index (BMI) 24.0-24.9, adult: Secondary | ICD-10-CM | POA: Diagnosis not present

## 2020-11-18 DIAGNOSIS — Z01419 Encounter for gynecological examination (general) (routine) without abnormal findings: Secondary | ICD-10-CM | POA: Diagnosis not present

## 2020-12-08 DIAGNOSIS — Z1231 Encounter for screening mammogram for malignant neoplasm of breast: Secondary | ICD-10-CM | POA: Diagnosis not present

## 2020-12-30 DIAGNOSIS — Z9189 Other specified personal risk factors, not elsewhere classified: Secondary | ICD-10-CM | POA: Diagnosis not present

## 2020-12-30 DIAGNOSIS — Z809 Family history of malignant neoplasm, unspecified: Secondary | ICD-10-CM | POA: Diagnosis not present

## 2021-01-07 DIAGNOSIS — Z8371 Family history of colonic polyps: Secondary | ICD-10-CM | POA: Diagnosis not present

## 2021-01-07 DIAGNOSIS — Z1211 Encounter for screening for malignant neoplasm of colon: Secondary | ICD-10-CM | POA: Diagnosis not present

## 2021-01-07 DIAGNOSIS — Z800 Family history of malignant neoplasm of digestive organs: Secondary | ICD-10-CM | POA: Diagnosis not present

## 2021-01-07 DIAGNOSIS — K648 Other hemorrhoids: Secondary | ICD-10-CM | POA: Diagnosis not present

## 2021-04-21 DIAGNOSIS — D751 Secondary polycythemia: Secondary | ICD-10-CM | POA: Diagnosis not present

## 2021-04-21 DIAGNOSIS — D1803 Hemangioma of intra-abdominal structures: Secondary | ICD-10-CM | POA: Diagnosis not present

## 2021-04-21 DIAGNOSIS — D72829 Elevated white blood cell count, unspecified: Secondary | ICD-10-CM | POA: Diagnosis not present

## 2021-04-21 DIAGNOSIS — D72828 Other elevated white blood cell count: Secondary | ICD-10-CM | POA: Diagnosis not present

## 2021-04-23 DIAGNOSIS — D72829 Elevated white blood cell count, unspecified: Secondary | ICD-10-CM | POA: Diagnosis not present

## 2021-04-23 DIAGNOSIS — D751 Secondary polycythemia: Secondary | ICD-10-CM | POA: Diagnosis not present

## 2021-04-23 DIAGNOSIS — D582 Other hemoglobinopathies: Secondary | ICD-10-CM | POA: Diagnosis not present

## 2021-05-17 NOTE — Progress Notes (Signed)
      HPI: FU mitral regurgitation, palpitations and chest tightness. Echocardiogram June 2021 showed normal LV function and mild mitral regurgitation.  Since last seen, she denies dyspnea on exertion, orthopnea, PND, pedal edema, chest pain, palpitations or syncope.  Current Outpatient Medications  Medication Sig Dispense Refill   furosemide (LASIX) 20 MG tablet Take 20 mg by mouth daily as needed.      No current facility-administered medications for this visit.     Past Medical History:  Diagnosis Date   Atypical mole 02/05/2004   Right Upper Breast (Dr. Katrina Stack)   Atypical mole 02/17/2005   Left Breast (Marked)   Atypical mole 02/17/2005   Supra Pubic (Moderate to Marked) (Dr. Gerome Apley II)   Atypical mole 08/28/2008   Left Lateral Abd, Sup (Moderate)   Atypical mole 08/28/2008   Left Lateral Abd, Inf (Slight to Moderate)   Atypical mole 08/28/2008   Left Lateral Abd, Medial (Moderate)   Atypical mole 03/18/2010   Left Tail od Breast (Moderate) (widershave)   Atypical mole 12/19/2012   Mid Abdomen (widershave)   Elevated white blood cell count     Past Surgical History:  Procedure Laterality Date   CHOLECYSTECTOMY     ELBOW SURGERY     HERNIA REPAIR     TONSILLECTOMY      Social History   Socioeconomic History   Marital status: Married    Spouse name: Not on file   Number of children: 2   Years of education: Not on file   Highest education level: Not on file  Occupational History   Not on file  Tobacco Use   Smoking status: Former   Smokeless tobacco: Never  Vaping Use   Vaping Use: Never used  Substance and Sexual Activity   Alcohol use: Yes    Comment: Rare   Drug use: Never   Sexual activity: Not on file  Other Topics Concern   Not on file  Social History Narrative   Not on file   Social Determinants of Health   Financial Resource Strain: Not on file  Food Insecurity: Not on file  Transportation Needs: Not on file  Physical  Activity: Not on file  Stress: Not on file  Social Connections: Not on file  Intimate Partner Violence: Not on file    Family History  Problem Relation Age of Onset   Breast cancer Mother     ROS: no fevers or chills, productive cough, hemoptysis, dysphasia, odynophagia, melena, hematochezia, dysuria, hematuria, rash, seizure activity, orthopnea, PND, pedal edema, claudication. Remaining systems are negative.  Physical Exam: Well-developed well-nourished in no acute distress.  Skin is warm and dry.  HEENT is normal.  Neck is supple.  Chest is clear to auscultation with normal expansion.  Cardiovascular exam is regular rate and rhythm.  Abdominal exam nontender or distended. No masses palpated. Extremities show no edema. neuro grossly intact  ECG-normal sinus rhythm at a rate of 62, no ST changes.  Personally reviewed  A/P  1 chest pain-no recent symptoms.  2 palpitations-symptoms are controlled.  3 mitral regurgitation-mild on most recent echocardiogram.  4 edema-euvolemic on examination.  We will continue Lasix as needed.  Kirk Ruths, MD

## 2021-05-26 ENCOUNTER — Other Ambulatory Visit: Payer: Self-pay

## 2021-05-26 ENCOUNTER — Encounter: Payer: Self-pay | Admitting: Cardiology

## 2021-05-26 ENCOUNTER — Ambulatory Visit: Payer: BC Managed Care – PPO | Admitting: Cardiology

## 2021-05-26 VITALS — BP 110/64 | HR 62 | Ht 65.0 in | Wt 148.8 lb

## 2021-05-26 DIAGNOSIS — R609 Edema, unspecified: Secondary | ICD-10-CM

## 2021-05-26 DIAGNOSIS — R072 Precordial pain: Secondary | ICD-10-CM | POA: Diagnosis not present

## 2021-05-26 DIAGNOSIS — R002 Palpitations: Secondary | ICD-10-CM | POA: Diagnosis not present

## 2021-05-26 NOTE — Patient Instructions (Signed)
  Follow-Up: At [facility], you and your health needs are our priority.  As part of our continuing mission to provide you with exceptional heart care, we have created designated Provider Care Teams.  These Care Teams include your primary Cardiologist (physician) and Advanced Practice Providers (APPs -  Physician Assistants and Nurse Practitioners) who all work together to provide you with the care you need, when you need it.  We recommend signing up for the patient portal called "MyChart".  Sign up information is provided on this After Visit Summary.  MyChart is used to connect with patients for Virtual Visits (Telemedicine).  Patients are able to view lab/test results, encounter notes, upcoming appointments, etc.  Non-urgent messages can be sent to your provider as well.   To learn more about what you can do with MyChart, go to [email or web address].    Your next appointment:   12 month(s)  The format for your next appointment:   In Person  Provider:   Kirk Ruths MD

## 2021-09-02 DIAGNOSIS — M5116 Intervertebral disc disorders with radiculopathy, lumbar region: Secondary | ICD-10-CM | POA: Diagnosis not present

## 2021-09-02 DIAGNOSIS — Z6823 Body mass index (BMI) 23.0-23.9, adult: Secondary | ICD-10-CM | POA: Diagnosis not present

## 2021-10-11 DIAGNOSIS — W548XXA Other contact with dog, initial encounter: Secondary | ICD-10-CM | POA: Diagnosis not present

## 2021-10-11 DIAGNOSIS — S68122A Partial traumatic metacarpophalangeal amputation of right middle finger, initial encounter: Secondary | ICD-10-CM | POA: Diagnosis not present

## 2021-10-11 DIAGNOSIS — S68119A Complete traumatic metacarpophalangeal amputation of unspecified finger, initial encounter: Secondary | ICD-10-CM | POA: Diagnosis not present

## 2021-10-11 DIAGNOSIS — S6991XA Unspecified injury of right wrist, hand and finger(s), initial encounter: Secondary | ICD-10-CM | POA: Diagnosis not present

## 2021-10-11 DIAGNOSIS — Y999 Unspecified external cause status: Secondary | ICD-10-CM | POA: Diagnosis not present

## 2021-10-26 DIAGNOSIS — D72829 Elevated white blood cell count, unspecified: Secondary | ICD-10-CM | POA: Diagnosis not present

## 2021-10-26 DIAGNOSIS — D751 Secondary polycythemia: Secondary | ICD-10-CM | POA: Diagnosis not present

## 2021-10-26 DIAGNOSIS — Z87891 Personal history of nicotine dependence: Secondary | ICD-10-CM | POA: Diagnosis not present

## 2021-10-26 DIAGNOSIS — D72828 Other elevated white blood cell count: Secondary | ICD-10-CM | POA: Diagnosis not present

## 2023-07-12 ENCOUNTER — Ambulatory Visit: Payer: BC Managed Care – PPO | Admitting: Podiatry

## 2023-07-12 ENCOUNTER — Ambulatory Visit (INDEPENDENT_AMBULATORY_CARE_PROVIDER_SITE_OTHER): Payer: BC Managed Care – PPO

## 2023-07-12 DIAGNOSIS — M722 Plantar fascial fibromatosis: Secondary | ICD-10-CM

## 2023-07-12 MED ORDER — PREDNISONE 10 MG PO TABS: ORAL_TABLET | ORAL | 0 refills | Status: AC

## 2023-07-12 MED ORDER — TRIAMCINOLONE ACETONIDE 10 MG/ML IJ SUSP
10.0000 mg | Freq: Once | INTRAMUSCULAR | Status: AC
  Administered 2023-07-12: 10 mg via INTRA_ARTICULAR

## 2023-07-12 NOTE — Progress Notes (Signed)
 Subjective:   Patient ID: Laura Blankenship, female   DOB: 53 y.o.   MRN: [id number]   HPI Patient states she is developed a lot of pain in her heels and now her feet in general also bother her and she has to do a lot of traveling and is on her feet quite a bit.  Patient states its gotten worse over the last number of months and patient does not smoke and likes to be active   Review of Systems  All other systems reviewed and are negative.       Objective:  Physical Exam Vitals and nursing note reviewed.  Constitutional:      Appearance: She is well-developed.  Pulmonary:     Effort: Pulmonary effort is normal.  Musculoskeletal:        General: Normal range of motion.  Skin:    General: Skin is warm.  Neurological:     Mental Status: She is alert.     Neurovascular status intact muscle strength found to be adequate range of motion adequate with exquisite discomfort in the plantar fascia bilateral with fluid buildup moderate depression of the arch mild forefoot structural discomfort also noted.  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  I then did sterile prep injected the fascia at insertion 3 mg Kenalog  5 mg Xylocaine I advised on support and I have recommended long-term orthotics and patient will be seen back for orthotic therapy.  Reappoint 2 weeks oral anti-inflammatory prescribed  X-rays indicate that there is mild calcification no indications of calcification around the area plantar fibroma bilateral

## 2023-07-28 ENCOUNTER — Ambulatory Visit: Payer: BC Managed Care – PPO | Admitting: Podiatry

## 2023-07-31 ENCOUNTER — Ambulatory Visit: Payer: BC Managed Care – PPO | Admitting: Podiatry

## 2023-07-31 DIAGNOSIS — M722 Plantar fascial fibromatosis: Secondary | ICD-10-CM

## 2023-07-31 MED ORDER — DICLOFENAC SODIUM 75 MG PO TBEC: 75.0000 mg | DELAYED_RELEASE_TABLET | Freq: Two times a day (BID) | ORAL | 2 refills | Status: AC

## 2023-07-31 NOTE — Progress Notes (Unsigned)
 Orthotics   Patient was present and evaluated for Custom molded foot orthotics. Patient will benefit from CFO's to provide total contact to BIL MLA's helping to balance and distribute body weight more evenly across BIL feet helping to reduce plantar pressure and pain. Orthotic will also encourage FF / RF alignment  Patient was scanned today and will return for fitting upon receipt

## 2023-08-31 ENCOUNTER — Other Ambulatory Visit: Payer: BC Managed Care – PPO

## 2023-10-06 ENCOUNTER — Other Ambulatory Visit

## 2023-10-24 ENCOUNTER — Ambulatory Visit

## 2023-10-24 NOTE — Progress Notes (Signed)
 Laveta Pottier never received order order placed again today with new scans  Rush added patient will return next week for fitting as I will call her when they are in   Britton Cane Cped, CFo, CFm

## 2023-11-03 ENCOUNTER — Telehealth: Payer: Self-pay

## 2023-11-03 NOTE — Telephone Encounter (Signed)
 LVM to schedule orthotic PU

## 2024-01-02 ENCOUNTER — Ambulatory Visit

## 2024-01-02 NOTE — Progress Notes (Signed)
Patient presents today to pick up custom molded foot orthotics, diagnosed with PF by Dr. Charlsie Merles.   Orthotics were dispensed and fit was satisfactory. Reviewed instructions for break-in and wear. Written instructions given to patient.  Patient will follow up as needed.   Addison Bailey Cped, CFo, CFm
